# Patient Record
Sex: Male | Born: 1975
Health system: Southern US, Community
[De-identification: ages and names within clinical notes are randomized; demographics above are authoritative.]

## PROBLEM LIST (undated history)

## (undated) DIAGNOSIS — D1730 Benign lipomatous neoplasm of skin and subcutaneous tissue of unspecified sites: Secondary | ICD-10-CM

## (undated) HISTORY — PX: TONSILLECTOMY: SUR1361

## (undated) HISTORY — PX: REFRACTIVE SURGERY: SHX103

---

## 2004-01-03 ENCOUNTER — Ambulatory Visit (HOSPITAL_COMMUNITY): Admission: RE | Admit: 2004-01-03 | Discharge: 2004-01-03 | Payer: Self-pay | Admitting: Family Medicine

## 2005-09-29 IMAGING — CR DG CHEST 2V
2 series · 2 of 2 positions shown · non-contrast
Comparison: none

CLINICAL DATA: Chronic cough x 2 months.

 CHEST (TWO VIEWS)
 The lungs are clear.  There is no focal infiltrate or edema.  Cardiomediastinal contours are within normal limits.  There are no pleural effusions.  
 IMPRESSION
 No radiographic evidence of acute cardiopulmonary process.

[view not recorded (1 of 2)]
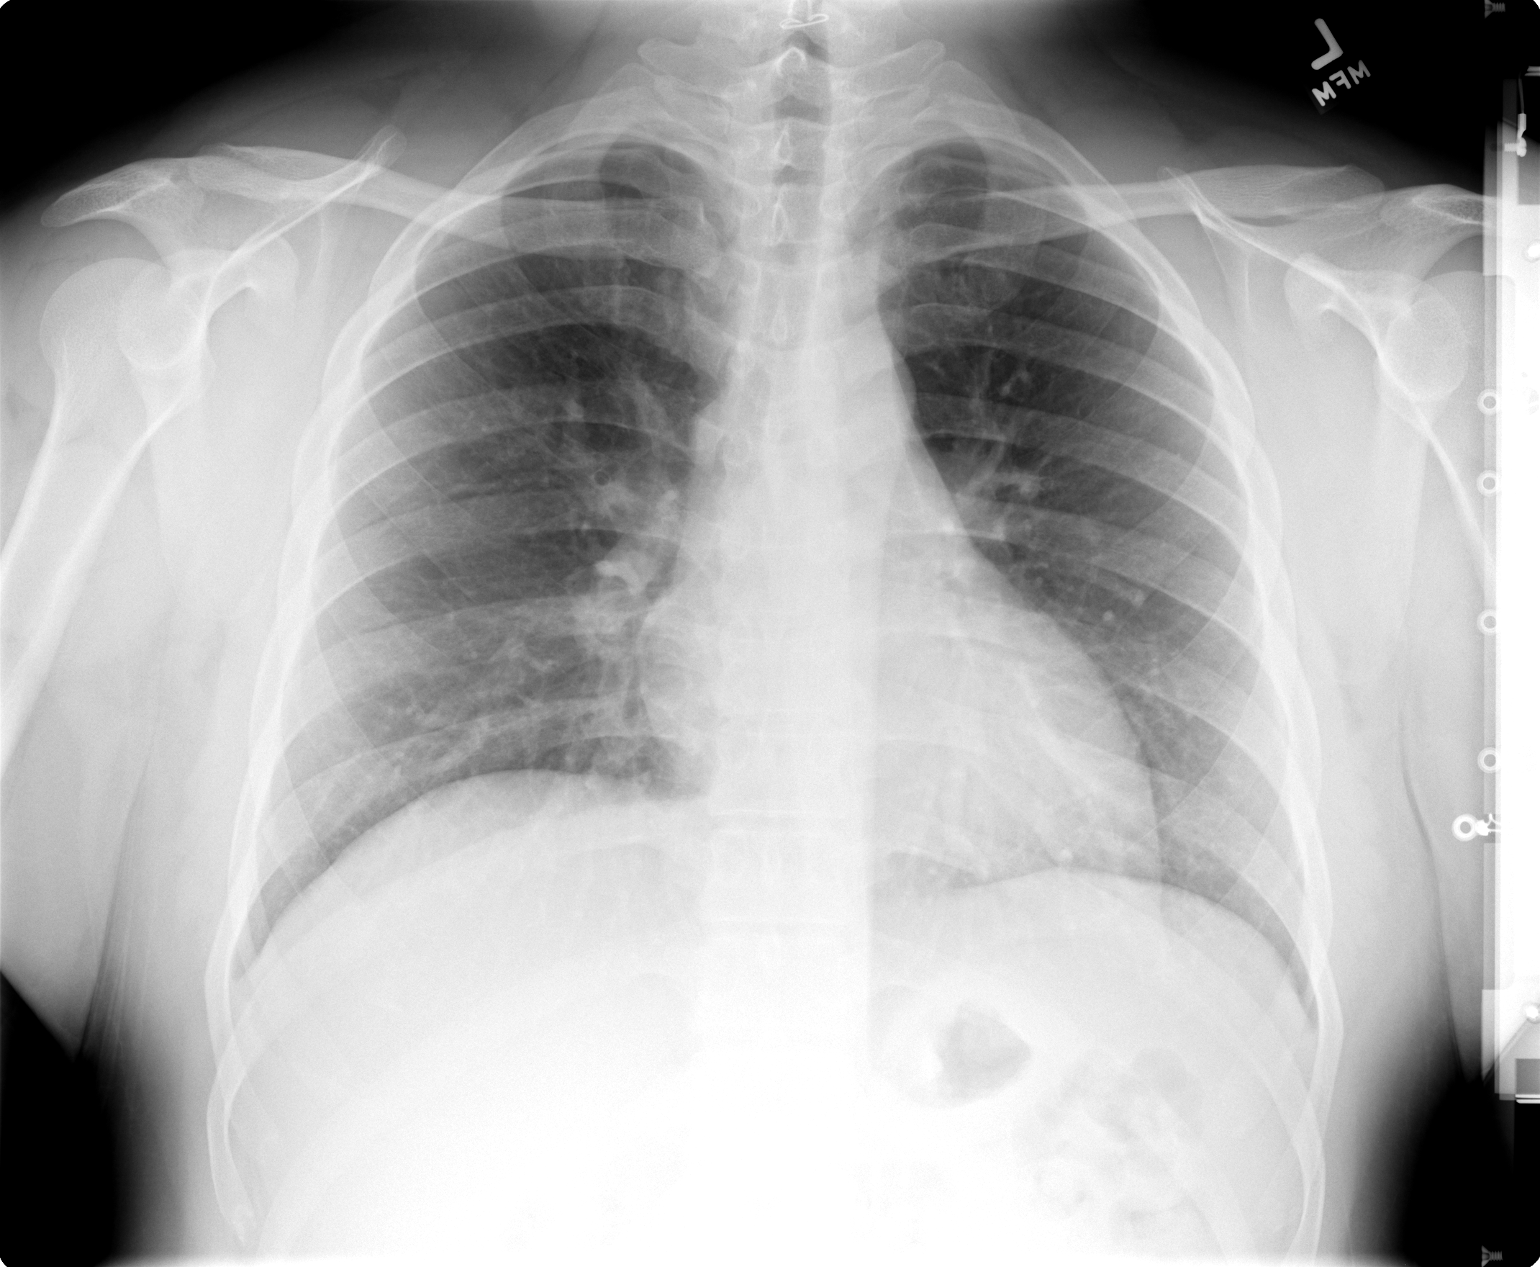

[view not recorded (2 of 2)]
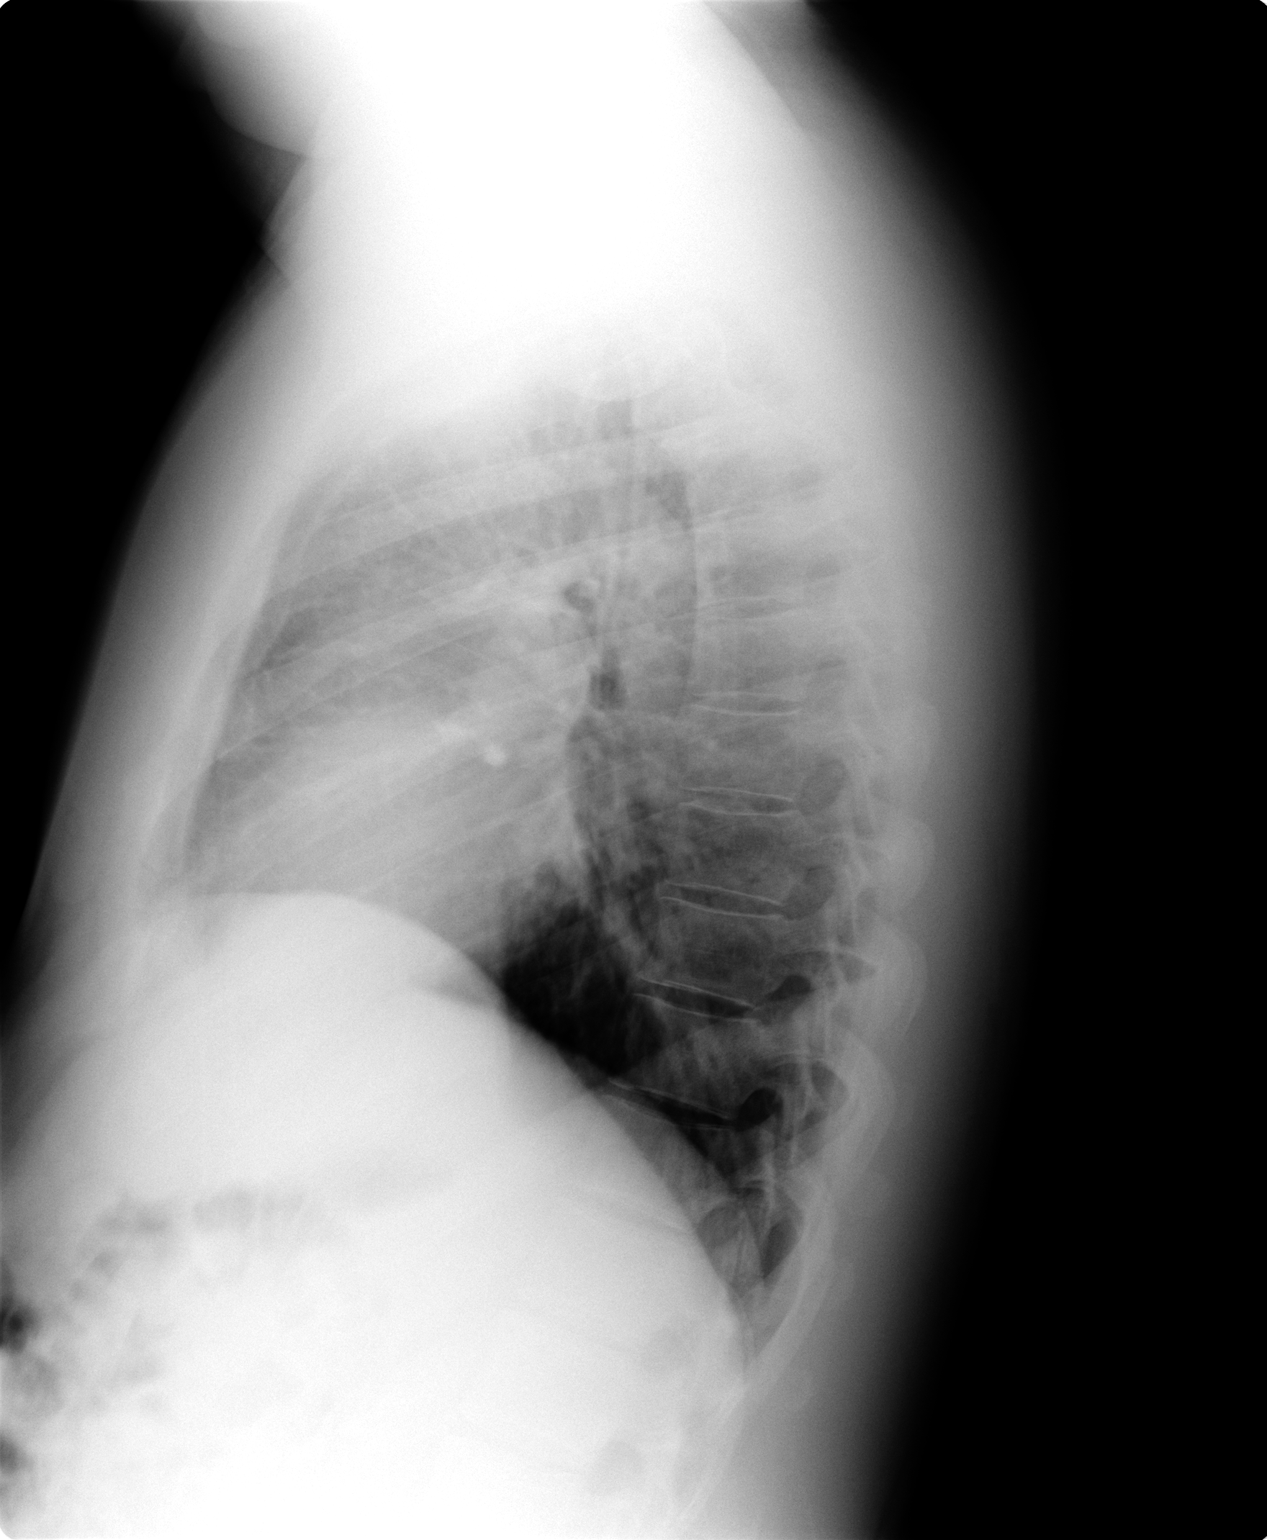

[2 of 2 positions shown; findings below may reference images not displayed]

## 2016-02-11 DIAGNOSIS — M7661 Achilles tendinitis, right leg: Secondary | ICD-10-CM | POA: Diagnosis not present

## 2016-02-11 DIAGNOSIS — B360 Pityriasis versicolor: Secondary | ICD-10-CM | POA: Diagnosis not present

## 2016-10-22 DIAGNOSIS — R030 Elevated blood-pressure reading, without diagnosis of hypertension: Secondary | ICD-10-CM | POA: Diagnosis not present

## 2016-10-22 DIAGNOSIS — J209 Acute bronchitis, unspecified: Secondary | ICD-10-CM | POA: Diagnosis not present

## 2017-08-17 DIAGNOSIS — J069 Acute upper respiratory infection, unspecified: Secondary | ICD-10-CM | POA: Diagnosis not present

## 2017-10-04 DIAGNOSIS — H6982 Other specified disorders of Eustachian tube, left ear: Secondary | ICD-10-CM | POA: Diagnosis not present

## 2017-10-04 DIAGNOSIS — R220 Localized swelling, mass and lump, head: Secondary | ICD-10-CM | POA: Diagnosis not present

## 2017-12-27 DIAGNOSIS — J301 Allergic rhinitis due to pollen: Secondary | ICD-10-CM | POA: Diagnosis not present

## 2017-12-27 DIAGNOSIS — B360 Pityriasis versicolor: Secondary | ICD-10-CM | POA: Diagnosis not present

## 2017-12-27 DIAGNOSIS — D170 Benign lipomatous neoplasm of skin and subcutaneous tissue of head, face and neck: Secondary | ICD-10-CM | POA: Diagnosis not present

## 2018-01-09 DIAGNOSIS — J209 Acute bronchitis, unspecified: Secondary | ICD-10-CM | POA: Diagnosis not present

## 2018-01-26 ENCOUNTER — Ambulatory Visit: Payer: Self-pay | Admitting: Surgery

## 2018-01-26 DIAGNOSIS — D170 Benign lipomatous neoplasm of skin and subcutaneous tissue of head, face and neck: Secondary | ICD-10-CM | POA: Diagnosis not present

## 2018-01-26 NOTE — H&P (View-Only) (Signed)
History of Present Illness Bryce Soto. Adisa Vigeant MD; 01/26/2018 2:38 PM) The patient is a 42 year old male who presents with a complaint of Mass. Referred by Marilynne Drivers, PA-C Reason for consultation scalp lipoma This is a healthy 42 year old male who presents with a 2-3 year history of a slowly enlarging mass on his posterior scalp. This has become larger and is causing some discomfort. It has never been infected. If patient keeps his head shaved and this has become fairly prominent and noticeable. He presents now for excision due to the worsening discomfort.   Past Surgical History [name]; 01/26/2018 2:04 PM) Oral Surgery  Diagnostic Studies History [name]; 01/26/2018 2:04 PM) Colonoscopy never  Allergies [name]; 01/26/2018 2:04 PM) No Known Drug Allergies [01/26/2018]: Allergies Reconciled  Medication History [name]; 01/26/2018 2:05 PM) Claritin (10MG  Tablet, Oral) Active. Medications Reconciled  Social History [name]; 01/26/2018 2:04 PM) Alcohol use Occasional alcohol use. Caffeine use Carbonated beverages. No drug use Tobacco use Never smoker.  Family History [name]; 01/26/2018 2:04 PM) Cancer Father. Thyroid problems Father, Mother.  Other Problems [name]; 01/26/2018 2:04 PM) No pertinent past medical history     Review of Systems [name]; 01/26/2018 2:04 PM) General Not Present- Appetite Loss, Chills, Fatigue, Fever, Night Sweats, Weight Gain and Weight Loss. Skin Not Present- Change in Wart/Mole, Dryness, Hives, Jaundice, New Lesions, Non-Healing Wounds, Rash and Ulcer. HEENT Not Present- Earache, Hearing Loss, Hoarseness, Nose Bleed, Oral Ulcers, Ringing in the Ears, Seasonal Allergies, Sinus Pain, Sore Throat, Visual Disturbances, Wears glasses/contact lenses and Yellow Eyes. Respiratory Not Present- Bloody sputum, Chronic Cough, Difficulty Breathing, Snoring and Wheezing. Breast Not Present- Breast  Mass, Breast Pain, Nipple Discharge and Skin Changes. Cardiovascular Not Present- Chest Pain, Difficulty Breathing Lying Down, Leg Cramps, Palpitations, Rapid Heart Rate, Shortness of Breath and Swelling of Extremities. Gastrointestinal Not Present- Abdominal Pain, Bloating, Bloody Stool, Change in Bowel Habits, Chronic diarrhea, Constipation, Difficulty Swallowing, Excessive gas, Gets full quickly at meals, Hemorrhoids, Indigestion, Nausea, Rectal Pain and Vomiting. Male Genitourinary Not Present- Blood in Urine, Change in Urinary Stream, Frequency, Impotence, Nocturia, Painful Urination, Urgency and Urine Leakage. Musculoskeletal Not Present- Back Pain, Joint Pain, Joint Stiffness, Muscle Pain, Muscle Weakness and Swelling of Extremities. Neurological Not Present- Decreased Memory, Fainting, Headaches, Numbness, Seizures, Tingling, Tremor, Trouble walking and Weakness. Psychiatric Not Present- Anxiety, Bipolar, Change in Sleep Pattern, Depression, Fearful and Frequent crying. Endocrine Not Present- Cold Intolerance, Excessive Hunger, Hair Changes, Heat Intolerance, Hot flashes and New Diabetes. Hematology Not Present- Blood Thinners, Easy Bruising, Excessive bleeding, Gland problems, HIV and Persistent Infections.  Vitals [name]; 01/26/2018 2:05 PM) 01/26/2018 2:05 PM Weight: 211 lb Height: 65in Body Surface Area: 2.02 m Body Mass Index: 35.11 kg/m  Temp.: 98.76F(Oral)  Pulse: 103 (Regular)  BP: 124/82 (Sitting, Left Arm, Standard)      Physical Exam Rodman Key K. Nereyda Bowler MD; 01/26/2018 2:38 PM)  The physical exam findings are as follows: Note:WDWN in NAD Scalp - posterior right 3 cm subcutaneous protruding mass, smooth, mobile Eyes: Pupils equal, round; sclera anicteric HENT: Oral mucosa moist; good dentition Neck: No masses palpated, no thyromegaly Lungs: CTA bilaterally; normal respiratory effort CV: Regular rate and rhythm; no murmurs; extremities well-perfused  with no edema Abd: +bowel sounds, soft, non-tender, no palpable organomegaly; no palpable hernias Skin: Warm, dry; no sign of jaundice Psychiatric - alert and oriented x 4; calm mood and affect    Assessment & Plan Rodman Key K. Averey Trompeter MD; 01/26/2018 2:18 PM)  LIPOMA OF SCALP (D17.0) Impression: Posterior scalp - 3 cm subcutaneous  Current Plans Schedule for Surgery - Excision of subcutaneous lipoma scalp. The surgical procedure has been discussed with the patient. Potential risks, benefits, alternative treatments, and expected outcomes have been explained. All of the patient's questions at this time have been answered. The likelihood of reaching the patient's treatment goal is good. The patient understand the proposed surgical procedure and wishes to proceed.  Bryce Soto. [name], MD, [facility]  General/ Trauma Surgery  01/26/2018 2:39 PM

## 2018-01-26 NOTE — H&P (Signed)
History of Present Illness Bryce Soto. Bryce Riederer MD; 01/26/2018 2:38 PM) The patient is a 42 year old male who presents with a complaint of Mass. Referred by Bryce Drivers, PA-C Reason for consultation scalp lipoma This is a healthy 42 year old male who presents with a 2-3 year history of a slowly enlarging mass on his posterior scalp. This has become larger and is causing some discomfort. It has never been infected. If patient keeps his head shaved and this has become fairly prominent and noticeable. He presents now for excision due to the worsening discomfort.   Past Surgical History Bryce Soto; 01/26/2018 2:04 PM) Oral Surgery  Diagnostic Studies History Bryce Soto; 01/26/2018 2:04 PM) Colonoscopy never  Allergies Bryce Soto; 01/26/2018 2:04 PM) No Known Drug Allergies [01/26/2018]: Allergies Reconciled  Medication History Bryce Soto; 01/26/2018 2:05 PM) Claritin (10MG  Tablet, Oral) Active. Medications Reconciled  Social History Bryce Soto; 01/26/2018 2:04 PM) Alcohol use Occasional alcohol use. Caffeine use Carbonated beverages. No drug use Tobacco use Never smoker.  Family History Bryce Soto; 01/26/2018 2:04 PM) Cancer Father. Thyroid problems Father, Mother.  Other Problems Bryce Soto; 01/26/2018 2:04 PM) No pertinent past medical history     Review of Systems Bryce Soto; 01/26/2018 2:04 PM) General Not Present- Appetite Loss, Chills, Fatigue, Fever, Night Sweats, Weight Gain and Weight Loss. Skin Not Present- Change in Wart/Mole, Dryness, Hives, Jaundice, New Lesions, Non-Healing Wounds, Rash and Ulcer. HEENT Not Present- Earache, Hearing Loss, Hoarseness, Nose Bleed, Oral Ulcers, Ringing in the Ears, Seasonal Allergies, Sinus Pain, Sore Throat, Visual Disturbances, Wears glasses/contact lenses and Yellow Eyes. Respiratory Not Present- Bloody sputum, Chronic Cough, Difficulty Breathing, Snoring and Wheezing. Breast Not Present- Breast  Mass, Breast Pain, Nipple Discharge and Skin Changes. Cardiovascular Not Present- Chest Pain, Difficulty Breathing Lying Down, Leg Cramps, Palpitations, Rapid Heart Rate, Shortness of Breath and Swelling of Extremities. Gastrointestinal Not Present- Abdominal Pain, Bloating, Bloody Stool, Change in Bowel Habits, Chronic diarrhea, Constipation, Difficulty Swallowing, Excessive gas, Gets full quickly at meals, Hemorrhoids, Indigestion, Nausea, Rectal Pain and Vomiting. Male Genitourinary Not Present- Blood in Urine, Change in Urinary Stream, Frequency, Impotence, Nocturia, Painful Urination, Urgency and Urine Leakage. Musculoskeletal Not Present- Back Pain, Joint Pain, Joint Stiffness, Muscle Pain, Muscle Weakness and Swelling of Extremities. Neurological Not Present- Decreased Memory, Fainting, Headaches, Numbness, Seizures, Tingling, Tremor, Trouble walking and Weakness. Psychiatric Not Present- Anxiety, Bipolar, Change in Sleep Pattern, Depression, Fearful and Frequent crying. Endocrine Not Present- Cold Intolerance, Excessive Hunger, Hair Changes, Heat Intolerance, Hot flashes and New Diabetes. Hematology Not Present- Blood Thinners, Easy Bruising, Excessive bleeding, Gland problems, HIV and Persistent Infections.  Vitals Bryce Soto; 01/26/2018 2:05 PM) 01/26/2018 2:05 PM Weight: 211 lb Height: 65in Body Surface Area: 2.02 m Body Mass Index: 35.11 kg/m  Temp.: 98.34F(Oral)  Pulse: 103 (Regular)  BP: 124/82 (Sitting, Left Arm, Standard)      Physical Exam Bryce Key K. Natsuko Kelsay MD; 01/26/2018 2:38 PM)  The physical exam findings are as follows: Note:WDWN in NAD Scalp - posterior right 3 cm subcutaneous protruding mass, smooth, mobile Eyes: Pupils equal, round; sclera anicteric HENT: Oral mucosa moist; good dentition Neck: No masses palpated, no thyromegaly Lungs: CTA bilaterally; normal respiratory effort CV: Regular rate and rhythm; no murmurs; extremities well-perfused  with no edema Abd: +bowel sounds, soft, non-tender, no palpable organomegaly; no palpable hernias Skin: Warm, dry; no sign of jaundice Psychiatric - alert and oriented x 4; calm mood and affect    Assessment & Plan Bryce Key K. [name] MD; 01/26/2018 2:18 PM)  LIPOMA OF SCALP (D17.0) Impression: Posterior scalp - 3 cm subcutaneous  Current Plans Schedule for Surgery - Excision of subcutaneous lipoma scalp. The surgical procedure has been discussed with the patient. Potential risks, benefits, alternative treatments, and expected outcomes have been explained. All of the patient's questions at this time have been answered. The likelihood of reaching the patient's treatment goal is good. The patient understand the proposed surgical procedure and wishes to proceed.  Bryce Soto. Bryce Dover, MD, [name]  General/ Trauma Surgery  01/26/2018 2:39 PM

## 2018-01-28 DIAGNOSIS — D1730 Benign lipomatous neoplasm of skin and subcutaneous tissue of unspecified sites: Secondary | ICD-10-CM

## 2018-01-28 HISTORY — DX: Benign lipomatous neoplasm of skin and subcutaneous tissue of unspecified sites: D17.30

## 2018-02-03 ENCOUNTER — Encounter (HOSPITAL_BASED_OUTPATIENT_CLINIC_OR_DEPARTMENT_OTHER): Payer: Self-pay | Admitting: *Deleted

## 2018-02-03 ENCOUNTER — Other Ambulatory Visit: Payer: Self-pay

## 2018-02-09 ENCOUNTER — Other Ambulatory Visit: Payer: Self-pay

## 2018-02-09 ENCOUNTER — Ambulatory Visit (HOSPITAL_BASED_OUTPATIENT_CLINIC_OR_DEPARTMENT_OTHER)
Admission: RE | Admit: 2018-02-09 | Discharge: 2018-02-09 | Disposition: A | Discharge status: Home / Self Care | Payer: BLUE CROSS/BLUE SHIELD | Source: Ambulatory Visit | Attending: Surgery | Admitting: Surgery

## 2018-02-09 ENCOUNTER — Encounter (HOSPITAL_BASED_OUTPATIENT_CLINIC_OR_DEPARTMENT_OTHER)
Admission: RE | Disposition: A | Discharge status: Home / Self Care | Payer: Self-pay | Source: Ambulatory Visit | Attending: Surgery

## 2018-02-09 ENCOUNTER — Encounter (HOSPITAL_BASED_OUTPATIENT_CLINIC_OR_DEPARTMENT_OTHER): Payer: Self-pay | Admitting: Anesthesiology

## 2018-02-09 ENCOUNTER — Ambulatory Visit (HOSPITAL_BASED_OUTPATIENT_CLINIC_OR_DEPARTMENT_OTHER): Payer: BLUE CROSS/BLUE SHIELD | Admitting: Anesthesiology

## 2018-02-09 DIAGNOSIS — Z8349 Family history of other endocrine, nutritional and metabolic diseases: Secondary | ICD-10-CM | POA: Insufficient documentation

## 2018-02-09 DIAGNOSIS — Z809 Family history of malignant neoplasm, unspecified: Secondary | ICD-10-CM | POA: Insufficient documentation

## 2018-02-09 DIAGNOSIS — Z79899 Other long term (current) drug therapy: Secondary | ICD-10-CM | POA: Diagnosis not present

## 2018-02-09 DIAGNOSIS — D170 Benign lipomatous neoplasm of skin and subcutaneous tissue of head, face and neck: Secondary | ICD-10-CM | POA: Insufficient documentation

## 2018-02-09 DIAGNOSIS — M7989 Other specified soft tissue disorders: Secondary | ICD-10-CM | POA: Diagnosis not present

## 2018-02-09 HISTORY — DX: Benign lipomatous neoplasm of skin and subcutaneous tissue of unspecified sites: D17.30

## 2018-02-09 HISTORY — PX: LIPOMA EXCISION: SHX5283

## 2018-02-09 SURGERY — EXCISION LIPOMA
Anesthesia: General | Site: Scalp

## 2018-02-09 MED ORDER — CEFAZOLIN SODIUM-DEXTROSE 2-4 GM/100ML-% IV SOLN
2.0000 g | INTRAVENOUS | Status: AC
  Administered 2018-02-09: 2 g via INTRAVENOUS

## 2018-02-09 MED ORDER — SUCCINYLCHOLINE CHLORIDE 200 MG/10ML IV SOSY
PREFILLED_SYRINGE | INTRAVENOUS | Status: AC
  Filled 2018-02-09: qty 10

## 2018-02-09 MED ORDER — HYDROMORPHONE HCL 1 MG/ML IJ SOLN: 0.2500 mg | INTRAMUSCULAR | Status: DC | PRN

## 2018-02-09 MED ORDER — BUPIVACAINE-EPINEPHRINE (PF) 0.25% -1:200000 IJ SOLN
INTRAMUSCULAR | Status: DC | PRN
  Administered 2018-02-09: 10 mL

## 2018-02-09 MED ORDER — DEXAMETHASONE SODIUM PHOSPHATE 10 MG/ML IJ SOLN
INTRAMUSCULAR | Status: AC
  Filled 2018-02-09: qty 1

## 2018-02-09 MED ORDER — MIDAZOLAM HCL 5 MG/5ML IJ SOLN
INTRAMUSCULAR | Status: DC | PRN
  Administered 2018-02-09: 2 mg via INTRAVENOUS

## 2018-02-09 MED ORDER — FENTANYL CITRATE (PF) 100 MCG/2ML IJ SOLN
INTRAMUSCULAR | Status: DC | PRN
  Administered 2018-02-09: 100 ug via INTRAVENOUS

## 2018-02-09 MED ORDER — OXYCODONE HCL 5 MG/5ML PO SOLN: 5.0000 mg | Freq: Once | ORAL | Status: DC | PRN

## 2018-02-09 MED ORDER — CHLORHEXIDINE GLUCONATE CLOTH 2 % EX PADS: 6.0000 | MEDICATED_PAD | Freq: Once | CUTANEOUS | Status: DC

## 2018-02-09 MED ORDER — LIDOCAINE HCL (CARDIAC) PF 100 MG/5ML IV SOSY
PREFILLED_SYRINGE | INTRAVENOUS | Status: DC | PRN
  Administered 2018-02-09: 30 mg via INTRAVENOUS

## 2018-02-09 MED ORDER — BUPIVACAINE-EPINEPHRINE (PF) 0.25% -1:200000 IJ SOLN
INTRAMUSCULAR | Status: AC
  Filled 2018-02-09: qty 30

## 2018-02-09 MED ORDER — FENTANYL CITRATE (PF) 100 MCG/2ML IJ SOLN: 50.0000 ug | INTRAMUSCULAR | Status: DC | PRN

## 2018-02-09 MED ORDER — OXYCODONE HCL 5 MG PO TABS: 5.0000 mg | ORAL_TABLET | Freq: Once | ORAL | Status: DC | PRN

## 2018-02-09 MED ORDER — MEPERIDINE HCL 25 MG/ML IJ SOLN: 6.2500 mg | INTRAMUSCULAR | Status: DC | PRN

## 2018-02-09 MED ORDER — ONDANSETRON HCL 4 MG/2ML IJ SOLN
INTRAMUSCULAR | Status: DC | PRN
  Administered 2018-02-09: 4 mg via INTRAVENOUS

## 2018-02-09 MED ORDER — LACTATED RINGERS IV SOLN
INTRAVENOUS | Status: DC
  Administered 2018-02-09: 10 mL/h via INTRAVENOUS
  Administered 2018-02-09: 08:00:00 via INTRAVENOUS

## 2018-02-09 MED ORDER — FENTANYL CITRATE (PF) 100 MCG/2ML IJ SOLN
INTRAMUSCULAR | Status: AC
  Filled 2018-02-09: qty 2

## 2018-02-09 MED ORDER — PROPOFOL 10 MG/ML IV BOLUS
INTRAVENOUS | Status: DC | PRN
  Administered 2018-02-09: 30 mg via INTRAVENOUS
  Administered 2018-02-09: 200 mg via INTRAVENOUS

## 2018-02-09 MED ORDER — PROMETHAZINE HCL 25 MG/ML IJ SOLN: 6.2500 mg | INTRAMUSCULAR | Status: DC | PRN

## 2018-02-09 MED ORDER — HYDROCODONE-ACETAMINOPHEN 5-325 MG PO TABS: 1.0000 | ORAL_TABLET | Freq: Four times a day (QID) | ORAL | 0 refills | Status: AC | PRN

## 2018-02-09 MED ORDER — PROPOFOL 10 MG/ML IV BOLUS
INTRAVENOUS | Status: AC
  Filled 2018-02-09: qty 20

## 2018-02-09 MED ORDER — CEFAZOLIN SODIUM-DEXTROSE 2-4 GM/100ML-% IV SOLN
INTRAVENOUS | Status: AC
  Filled 2018-02-09: qty 100

## 2018-02-09 MED ORDER — MIDAZOLAM HCL 2 MG/2ML IJ SOLN
INTRAMUSCULAR | Status: AC
  Filled 2018-02-09: qty 2

## 2018-02-09 MED ORDER — SCOPOLAMINE 1 MG/3DAYS TD PT72: 1.0000 | MEDICATED_PATCH | Freq: Once | TRANSDERMAL | Status: DC | PRN

## 2018-02-09 MED ORDER — LACTATED RINGERS IV SOLN
INTRAVENOUS | Status: DC
  Administered 2018-02-09: 11:00:00 via INTRAVENOUS

## 2018-02-09 MED ORDER — DEXAMETHASONE SODIUM PHOSPHATE 4 MG/ML IJ SOLN
INTRAMUSCULAR | Status: DC | PRN
  Administered 2018-02-09: 10 mg via INTRAVENOUS

## 2018-02-09 MED ORDER — MIDAZOLAM HCL 2 MG/2ML IJ SOLN: 1.0000 mg | INTRAMUSCULAR | Status: DC | PRN

## 2018-02-09 MED ORDER — LIDOCAINE HCL (CARDIAC) PF 100 MG/5ML IV SOSY
PREFILLED_SYRINGE | INTRAVENOUS | Status: AC
  Filled 2018-02-09: qty 5

## 2018-02-09 MED ORDER — ONDANSETRON HCL 4 MG/2ML IJ SOLN
INTRAMUSCULAR | Status: AC
  Filled 2018-02-09: qty 2

## 2018-02-09 SURGICAL SUPPLY — 44 items
BENZOIN TINCTURE PRP APPL 2/3 (GAUZE/BANDAGES/DRESSINGS) IMPLANT
BLADE CLIPPER SURG (BLADE) IMPLANT
BLADE SURG 15 STRL LF DISP TIS (BLADE) ×1 IMPLANT
BLADE SURG 15 STRL SS (BLADE) ×1
CANISTER SUCT 1200ML W/VALVE (MISCELLANEOUS) IMPLANT
CHLORAPREP W/TINT 26ML (MISCELLANEOUS) ×2 IMPLANT
COVER BACK TABLE 60X90IN (DRAPES) ×2 IMPLANT
COVER MAYO STAND STRL (DRAPES) ×2 IMPLANT
DECANTER SPIKE VIAL GLASS SM (MISCELLANEOUS) IMPLANT
DERMABOND ADVANCED (GAUZE/BANDAGES/DRESSINGS) ×1
DERMABOND ADVANCED .7 DNX12 (GAUZE/BANDAGES/DRESSINGS) ×1 IMPLANT
DRAPE LAPAROTOMY 100X72 PEDS (DRAPES) ×2 IMPLANT
DRAPE UTILITY XL STRL (DRAPES) ×2 IMPLANT
DRSG TEGADERM 4X4.75 (GAUZE/BANDAGES/DRESSINGS) IMPLANT
ELECT COATED BLADE 2.86 ST (ELECTRODE) ×2 IMPLANT
ELECT REM PT RETURN 9FT ADLT (ELECTROSURGICAL) ×2
ELECTRODE REM PT RTRN 9FT ADLT (ELECTROSURGICAL) ×1 IMPLANT
GAUZE SPONGE 4X4 12PLY STRL LF (GAUZE/BANDAGES/DRESSINGS) IMPLANT
GLOVE BIO SURGEON STRL SZ7 (GLOVE) ×2 IMPLANT
GLOVE BIOGEL PI IND STRL 7.5 (GLOVE) ×1 IMPLANT
GLOVE BIOGEL PI INDICATOR 7.5 (GLOVE) ×1
GOWN STRL REUS W/ TWL LRG LVL3 (GOWN DISPOSABLE) ×2 IMPLANT
GOWN STRL REUS W/TWL LRG LVL3 (GOWN DISPOSABLE) ×2
NEEDLE HYPO 25X1 1.5 SAFETY (NEEDLE) ×2 IMPLANT
NS IRRIG 1000ML POUR BTL (IV SOLUTION) IMPLANT
PACK BASIN DAY SURGERY FS (CUSTOM PROCEDURE TRAY) ×2 IMPLANT
PENCIL BUTTON HOLSTER BLD 10FT (ELECTRODE) ×2 IMPLANT
SLEEVE SCD COMPRESS KNEE MED (MISCELLANEOUS) ×2 IMPLANT
SPONGE GAUZE 2X2 8PLY STRL LF (GAUZE/BANDAGES/DRESSINGS) IMPLANT
STRIP CLOSURE SKIN 1/2X4 (GAUZE/BANDAGES/DRESSINGS) IMPLANT
SUCTION FRAZIER HANDLE 10FR (MISCELLANEOUS) ×1
SUCTION TUBE FRAZIER 10FR DISP (MISCELLANEOUS) ×1 IMPLANT
SUT MON AB 4-0 PC3 18 (SUTURE) ×2 IMPLANT
SUT PROLENE 6 0 P 1 18 (SUTURE) IMPLANT
SUT SILK 2 0 PERMA HAND 18 BK (SUTURE) IMPLANT
SUT VIC AB 3-0 SH 27 (SUTURE) ×1
SUT VIC AB 3-0 SH 27X BRD (SUTURE) ×1 IMPLANT
SUT VICRYL 3-0 CR8 SH (SUTURE) IMPLANT
SYR BULB 3OZ (MISCELLANEOUS) ×2 IMPLANT
SYR CONTROL 10ML LL (SYRINGE) ×2 IMPLANT
TOWEL GREEN STERILE FF (TOWEL DISPOSABLE) IMPLANT
TOWEL OR NON WOVEN STRL DISP B (DISPOSABLE) ×2 IMPLANT
TUBE CONNECTING 20X1/4 (TUBING) ×2 IMPLANT
YANKAUER SUCT BULB TIP NO VENT (SUCTIONS) IMPLANT

## 2018-02-09 NOTE — Anesthesia Preprocedure Evaluation (Addendum)
Anesthesia Evaluation  Patient identified by MRN, date of birth, ID band Patient awake    Reviewed: Allergy & Precautions, NPO status , Patient's Chart, lab work & pertinent test results  Airway Mallampati: II  TM Distance: >3 FB Neck ROM: Full    Dental  (+) Teeth Intact, Dental Advisory Given, Chipped,    Pulmonary neg pulmonary ROS,    breath sounds clear to auscultation       Cardiovascular negative cardio ROS   Rhythm:Regular Rate:Normal     Neuro/Psych negative neurological ROS  negative psych ROS   GI/Hepatic negative GI ROS, Neg liver ROS,   Endo/Other  negative endocrine ROS  Renal/GU negative Renal ROS  negative genitourinary   Musculoskeletal negative musculoskeletal ROS (+)   Abdominal Normal abdominal exam  (+)   Peds  Hematology negative hematology ROS (+)   Anesthesia Other Findings   Reproductive/Obstetrics                            Anesthesia Physical Anesthesia Plan  ASA: I  Anesthesia Plan: General   Post-op Pain Management:    Induction: Intravenous  PONV Risk Score and Plan: 3 and Ondansetron, Dexamethasone and Midazolam  Airway Management Planned: Oral ETT  Additional Equipment: None  Intra-op Plan:   Post-operative Plan: Extubation in OR  Informed Consent: I have reviewed the patients History and Physical, chart, labs and discussed the procedure including the risks, benefits and alternatives for the proposed anesthesia with the patient or authorized representative who has indicated his/her understanding and acceptance.   Dental advisory given  Plan Discussed with: CRNA  Anesthesia Plan Comments:        Anesthesia Quick Evaluation

## 2018-02-09 NOTE — Anesthesia Postprocedure Evaluation (Signed)
Anesthesia Post Note  Patient: Bryce Soto  Procedure(s) Performed: EXCISION OF SUBCUTANEOUS LIPOMA - POSTERIOR SCALP (N/A Scalp)     Patient location during evaluation: PACU Anesthesia Type: General Level of consciousness: awake and alert Pain management: pain level controlled Vital Signs Assessment: post-procedure vital signs reviewed and stable Respiratory status: spontaneous breathing, nonlabored ventilation, respiratory function stable and patient connected to nasal cannula oxygen Cardiovascular status: blood pressure returned to baseline and stable Postop Assessment: no apparent nausea or vomiting Anesthetic complications: no    Last Vitals:  Vitals:   02/09/18 1104 02/09/18 1120  BP:  136/90  Pulse: 86 84  Resp: 15 16  Temp:  36.5 C  SpO2: 98% 98%    Last Pain:  Vitals:   02/09/18 1125  TempSrc:   PainSc: 0-No pain                 Effie Berkshire

## 2018-02-09 NOTE — Anesthesia Procedure Notes (Signed)
Procedure Name: Intubation Date/Time: 02/09/2018 9:15 AM Performed by: Marrianne Mood, CRNA Pre-anesthesia Checklist: Patient identified, Emergency Drugs available, Suction available, Patient being monitored and Timeout performed Patient Re-evaluated:Patient Re-evaluated prior to induction Oxygen Delivery Method: Circle system utilized Preoxygenation: Pre-oxygenation with 100% oxygen Induction Type: IV induction Ventilation: Mask ventilation without difficulty Laryngoscope Size: Glidescope, Mac and 3 Grade View: Grade II Tube type: Oral Tube size: 8.0 mm Number of attempts: 1 Airway Equipment and Method: Stylet and Oral airway Placement Confirmation: ETT inserted through vocal cords under direct vision,  positive ETCO2 and breath sounds checked- equal and bilateral Secured at: 20 cm Tube secured with: Tape Dental Injury: Teeth and Oropharynx as per pre-operative assessment  Difficulty Due To: Difficulty was anticipated

## 2018-02-09 NOTE — Discharge Instructions (Signed)
[place] Office Phone Number [phone]  Lipoma Excision: POST OP INSTRUCTIONS  Always review your discharge instruction sheet given to you by the facility where your surgery was performed.  IF YOU HAVE DISABILITY OR FAMILY LEAVE FORMS, YOU MUST BRING THEM TO THE OFFICE FOR PROCESSING.  DO NOT GIVE THEM TO YOUR DOCTOR.  1. A prescription for pain medication may be given to you upon discharge.  Take your pain medication as prescribed, if needed.  If narcotic pain medicine is not needed, then you may take acetaminophen (Tylenol) or ibuprofen (Advil) as needed. 2. Take your usually prescribed medications unless otherwise directed 3. If you need a refill on your pain medication, please contact your pharmacy.  They will contact our office to request authorization.  Prescriptions will not be filled after 5pm or on week-ends. 4. You should eat very light the first 24 hours after surgery, such as soup, crackers, pudding, etc.  Resume your normal diet the day after surgery. 5. Most patients will experience some swelling and bruising around the surgical site.  Ice packs will help.  Swelling and bruising can take several days to resolve.  6. It is common to experience some constipation if taking pain medication after surgery.  Increasing fluid intake and taking a stool softener will usually help or prevent this problem from occurring.  A mild laxative (Milk of Magnesia or Miralax) should be taken according to package directions if there are no bowel movements after 48 hours. 7. You may shower over the skin glue in 24 hours.  The glue will flake off over the next 1-2 weeks. 8. ACTIVITIES:  You may resume regular daily activities (gradually increasing) beginning the next day.   You may have sexual intercourse when it is comfortable. a. You may drive when you no longer are taking prescription pain medication, you can comfortably wear a seatbelt, and you can safely maneuver your car and apply  brakes. b. RETURN TO WORK:  1-2 weeks 9. You should see your doctor in the office for a follow-up appointment approximately two to three weeks after your surgery.    WHEN TO CALL YOUR DOCTOR: 1. Fever over 101.0 2. Nausea and/or vomiting. 3. Extreme swelling or bruising. 4. Continued bleeding from incision. 5. Increased pain, redness, or drainage from the incision.  The clinic staff is available to answer your questions during regular business hours.  Please dont hesitate to call and ask to speak to one of the nurses for clinical concerns.  If you have a medical emergency, go to the nearest emergency room or call 911.  A surgeon from [facility] is always on call at the hospital.  For further questions, please visit [email or web address]         Post Anesthesia Home Care Instructions  Activity: Get plenty of rest for the remainder of the day. A responsible individual must stay with you for 24 hours following the procedure.  For the next 24 hours, DO NOT: -Drive a car -Paediatric nurse -Drink alcoholic beverages -Take any medication unless instructed by your physician -Make any legal decisions or sign important papers.  Meals: Start with liquid foods such as gelatin or soup. Progress to regular foods as tolerated. Avoid greasy, spicy, heavy foods. If nausea and/or vomiting occur, drink only clear liquids until the nausea and/or vomiting subsides. Call your physician if vomiting continues.  Special Instructions/Symptoms: Your throat may feel dry or sore from the anesthesia or the breathing tube placed in your throat during  surgery. If this causes discomfort, gargle with warm salt water. The discomfort should disappear within 24 hours.  If you had a scopolamine patch placed behind your ear for the management of post- operative nausea and/or vomiting:  1. The medication in the patch is effective for 72 hours, after which it should be removed.  Wrap patch in a  tissue and discard in the trash. Wash hands thoroughly with soap and water. 2. You may remove the patch earlier than 72 hours if you experience unpleasant side effects which may include dry mouth, dizziness or visual disturbances. 3. Avoid touching the patch. Wash your hands with soap and water after contact with the patch.

## 2018-02-09 NOTE — Interval H&P Note (Signed)
History and Physical Interval Note:  02/09/2018 8:28 AM  Bryce Soto  has presented today for surgery, with the diagnosis of Subcutaneous Lipoma - Posterior Scalp  The various methods of treatment have been discussed with the patient and family. After consideration of risks, benefits and other options for treatment, the patient has consented to  Procedure(s): EXCISION OF SUBCUTANEOUS LIPOMA - POSTERIOR SCALP (N/A) as a surgical intervention .  The patient's history has been reviewed, patient examined, no change in status, stable for surgery.  I have reviewed the patient's chart and labs.  Questions were answered to the patient's satisfaction.     Maia Petties

## 2018-02-09 NOTE — Op Note (Signed)
Preop diagnosis: Subcutaneous lipoma posterior scalp Postop diagnosis: Same Procedure performed: Excision of subcutaneous lipoma posterior scalp The lipoma measured 3 cm in diameter.  Surgeon:Reneta Niehaus K Kindle Strohmeier Anesthesia: General Indications: This is a 42 year old male who presents with several years of an enlarging mass in the subcutaneous tissues on his posterior scalp.  This has begun to cause some discomfort.  He would like to have this removed.  Description of procedure: The patient brought to the operating room placed supine position on operating room table.  After an adequate level of general anesthesia was obtained, he was moved to a lateral position on his left side.  His posterior scalp was prepped with ChloraPrep and draped in sterile fashion.  A timeout was taken to ensure the proper patient and proper procedure.  We infiltrated the area over the mass with 0.25% Marcaine with epinephrine.  A transverse incision was made.  We dissected down through the subcutaneous tissues until we encountered the lipoma.  We dissected the lipoma away from the surrounding tissue.  The lipoma was removed.  We inspected carefully for hemostasis.  There was no additional or residual lipoma within the wound.  The wound was closed with a deep layer of 3-0 Vicryl and a subcuticular layer 4-0 Monocryl.  Dermabond was used to seal the skin.  The patient was then extubated and brought to recovery room in stable condition.  All sponge, instrument, and needle counts are correct.  Imogene Burn. [name], MD, [facility]  General/ Trauma Surgery  02/09/2018 10:06 AM

## 2018-02-09 NOTE — Transfer of Care (Signed)
Immediate Anesthesia Transfer of Care Note  Patient: Bryce Soto  Procedure(s) Performed: EXCISION OF SUBCUTANEOUS LIPOMA - POSTERIOR SCALP (N/A Scalp)  Patient Location: PACU  Anesthesia Type:General  Level of Consciousness: awake  Airway & Oxygen Therapy: Patient Spontanous Breathing and Patient connected to face mask oxygen  Post-op Assessment: Report given to RN and Post -op Vital signs reviewed and stable  Post vital signs: Reviewed and stable  Last Vitals:  Vitals Value Taken Time  BP    Temp    Pulse 90 02/09/2018 10:12 AM  Resp    SpO2 100 % 02/09/2018 10:12 AM  Vitals shown include unvalidated device data.  Last Pain:  Vitals:   02/09/18 0733  TempSrc: Oral  PainSc: 0-No pain         Complications: No apparent anesthesia complications

## 2018-02-10 ENCOUNTER — Encounter (HOSPITAL_BASED_OUTPATIENT_CLINIC_OR_DEPARTMENT_OTHER): Payer: Self-pay | Admitting: Surgery

## 2018-03-31 DIAGNOSIS — T63441A Toxic effect of venom of bees, accidental (unintentional), initial encounter: Secondary | ICD-10-CM | POA: Diagnosis not present

## 2018-07-13 DIAGNOSIS — H6982 Other specified disorders of Eustachian tube, left ear: Secondary | ICD-10-CM | POA: Diagnosis not present

## 2018-07-13 DIAGNOSIS — J018 Other acute sinusitis: Secondary | ICD-10-CM | POA: Diagnosis not present

## 2018-11-10 DIAGNOSIS — Z0000 Encounter for general adult medical examination without abnormal findings: Secondary | ICD-10-CM | POA: Diagnosis not present

## 2018-12-05 DIAGNOSIS — R05 Cough: Secondary | ICD-10-CM | POA: Diagnosis not present

## 2018-12-05 DIAGNOSIS — J302 Other seasonal allergic rhinitis: Secondary | ICD-10-CM | POA: Diagnosis not present

## 2018-12-09 DIAGNOSIS — Z20828 Contact with and (suspected) exposure to other viral communicable diseases: Secondary | ICD-10-CM | POA: Diagnosis not present

## 2018-12-09 DIAGNOSIS — J209 Acute bronchitis, unspecified: Secondary | ICD-10-CM | POA: Diagnosis not present

## 2018-12-15 DIAGNOSIS — J40 Bronchitis, not specified as acute or chronic: Secondary | ICD-10-CM | POA: Diagnosis not present

## 2018-12-20 DIAGNOSIS — R05 Cough: Secondary | ICD-10-CM | POA: Diagnosis not present

## 2018-12-20 DIAGNOSIS — R069 Unspecified abnormalities of breathing: Secondary | ICD-10-CM | POA: Diagnosis not present

## 2018-12-25 DIAGNOSIS — J309 Allergic rhinitis, unspecified: Secondary | ICD-10-CM | POA: Diagnosis not present

## 2019-08-14 DIAGNOSIS — Z20828 Contact with and (suspected) exposure to other viral communicable diseases: Secondary | ICD-10-CM | POA: Diagnosis not present

## 2019-08-14 DIAGNOSIS — Z03818 Encounter for observation for suspected exposure to other biological agents ruled out: Secondary | ICD-10-CM | POA: Diagnosis not present

## 2019-08-17 DIAGNOSIS — B349 Viral infection, unspecified: Secondary | ICD-10-CM | POA: Diagnosis not present

## 2019-11-16 DIAGNOSIS — M5386 Other specified dorsopathies, lumbar region: Secondary | ICD-10-CM | POA: Diagnosis not present

## 2019-11-16 DIAGNOSIS — M9905 Segmental and somatic dysfunction of pelvic region: Secondary | ICD-10-CM | POA: Diagnosis not present

## 2019-11-16 DIAGNOSIS — M9903 Segmental and somatic dysfunction of lumbar region: Secondary | ICD-10-CM | POA: Diagnosis not present

## 2019-11-16 DIAGNOSIS — M5137 Other intervertebral disc degeneration, lumbosacral region: Secondary | ICD-10-CM | POA: Diagnosis not present

## 2019-11-23 DIAGNOSIS — M5137 Other intervertebral disc degeneration, lumbosacral region: Secondary | ICD-10-CM | POA: Diagnosis not present

## 2019-11-23 DIAGNOSIS — M9905 Segmental and somatic dysfunction of pelvic region: Secondary | ICD-10-CM | POA: Diagnosis not present

## 2019-11-23 DIAGNOSIS — M9903 Segmental and somatic dysfunction of lumbar region: Secondary | ICD-10-CM | POA: Diagnosis not present

## 2019-11-23 DIAGNOSIS — M5386 Other specified dorsopathies, lumbar region: Secondary | ICD-10-CM | POA: Diagnosis not present

## 2019-11-29 DIAGNOSIS — M5137 Other intervertebral disc degeneration, lumbosacral region: Secondary | ICD-10-CM | POA: Diagnosis not present

## 2019-11-29 DIAGNOSIS — M5386 Other specified dorsopathies, lumbar region: Secondary | ICD-10-CM | POA: Diagnosis not present

## 2019-11-29 DIAGNOSIS — M9905 Segmental and somatic dysfunction of pelvic region: Secondary | ICD-10-CM | POA: Diagnosis not present

## 2019-11-29 DIAGNOSIS — M9903 Segmental and somatic dysfunction of lumbar region: Secondary | ICD-10-CM | POA: Diagnosis not present

## 2019-12-05 DIAGNOSIS — L0292 Furuncle, unspecified: Secondary | ICD-10-CM | POA: Diagnosis not present

## 2019-12-05 DIAGNOSIS — L219 Seborrheic dermatitis, unspecified: Secondary | ICD-10-CM | POA: Diagnosis not present

## 2020-02-11 DIAGNOSIS — R140 Abdominal distension (gaseous): Secondary | ICD-10-CM | POA: Diagnosis not present

## 2020-02-11 DIAGNOSIS — R197 Diarrhea, unspecified: Secondary | ICD-10-CM | POA: Diagnosis not present

## 2020-06-27 DIAGNOSIS — H9202 Otalgia, left ear: Secondary | ICD-10-CM | POA: Diagnosis not present

## 2020-06-27 DIAGNOSIS — K921 Melena: Secondary | ICD-10-CM | POA: Diagnosis not present

## 2020-06-27 DIAGNOSIS — R030 Elevated blood-pressure reading, without diagnosis of hypertension: Secondary | ICD-10-CM | POA: Diagnosis not present

## 2020-08-01 DIAGNOSIS — K625 Hemorrhage of anus and rectum: Secondary | ICD-10-CM | POA: Diagnosis not present

## 2020-08-01 DIAGNOSIS — K611 Rectal abscess: Secondary | ICD-10-CM | POA: Diagnosis not present

## 2021-06-18 DIAGNOSIS — L720 Epidermal cyst: Secondary | ICD-10-CM | POA: Diagnosis not present

## 2021-07-22 DIAGNOSIS — H6591 Unspecified nonsuppurative otitis media, right ear: Secondary | ICD-10-CM | POA: Diagnosis not present

## 2021-07-22 DIAGNOSIS — L309 Dermatitis, unspecified: Secondary | ICD-10-CM | POA: Diagnosis not present

## 2022-07-02 DIAGNOSIS — Z131 Encounter for screening for diabetes mellitus: Secondary | ICD-10-CM | POA: Diagnosis not present

## 2022-07-02 DIAGNOSIS — Z13220 Encounter for screening for lipoid disorders: Secondary | ICD-10-CM | POA: Diagnosis not present

## 2022-07-02 DIAGNOSIS — Z0000 Encounter for general adult medical examination without abnormal findings: Secondary | ICD-10-CM | POA: Diagnosis not present

## 2022-08-11 DIAGNOSIS — H60332 Swimmer's ear, left ear: Secondary | ICD-10-CM | POA: Diagnosis not present

## 2022-08-11 DIAGNOSIS — B372 Candidiasis of skin and nail: Secondary | ICD-10-CM | POA: Diagnosis not present

## 2022-08-13 DIAGNOSIS — H6502 Acute serous otitis media, left ear: Secondary | ICD-10-CM | POA: Diagnosis not present

## 2022-08-13 DIAGNOSIS — J069 Acute upper respiratory infection, unspecified: Secondary | ICD-10-CM | POA: Diagnosis not present
